# Patient Record
Sex: Male | Born: 1935
Health system: Southern US, Community
[De-identification: ages and names within clinical notes are randomized; demographics above are authoritative.]

## PROBLEM LIST (undated history)

## (undated) DIAGNOSIS — I10 Essential (primary) hypertension: Secondary | ICD-10-CM

## (undated) DIAGNOSIS — E119 Type 2 diabetes mellitus without complications: Secondary | ICD-10-CM

## (undated) HISTORY — PX: TOTAL HIP ARTHROPLASTY: SHX124

---

## 2008-04-08 ENCOUNTER — Encounter: Payer: Self-pay | Admitting: Orthopedic Surgery

## 2008-05-03 ENCOUNTER — Encounter: Payer: Self-pay | Admitting: Orthopedic Surgery

## 2008-06-02 ENCOUNTER — Encounter: Payer: Self-pay | Admitting: Orthopedic Surgery

## 2012-02-27 ENCOUNTER — Ambulatory Visit: Payer: Self-pay | Admitting: General Practice

## 2012-02-27 LAB — MRSA PCR SCREENING

## 2012-02-27 LAB — BASIC METABOLIC PANEL
Anion Gap: 6 — ABNORMAL LOW (ref 7–16)
Calcium, Total: 9.3 mg/dL (ref 8.5–10.1)
Chloride: 104 mmol/L (ref 98–107)
Co2: 27 mmol/L (ref 21–32)
Creatinine: 1.15 mg/dL (ref 0.60–1.30)
Glucose: 95 mg/dL (ref 65–99)
Potassium: 3.8 mmol/L (ref 3.5–5.1)
Sodium: 137 mmol/L (ref 136–145)

## 2012-02-27 LAB — URINALYSIS, COMPLETE
Bilirubin,UR: NEGATIVE
Blood: NEGATIVE
Ketone: NEGATIVE
Ph: 5 (ref 4.5–8.0)
Protein: NEGATIVE
Specific Gravity: 1.021 (ref 1.003–1.030)

## 2012-02-27 LAB — APTT: Activated PTT: 29.6 secs (ref 23.6–35.9)

## 2012-02-27 LAB — CBC
HCT: 52.7 % — ABNORMAL HIGH (ref 40.0–52.0)
MCHC: 34.1 g/dL (ref 32.0–36.0)
MCV: 87 fL (ref 80–100)
RBC: 6.04 10*6/uL — ABNORMAL HIGH (ref 4.40–5.90)
WBC: 12.8 10*3/uL — ABNORMAL HIGH (ref 3.8–10.6)

## 2012-02-27 LAB — SEDIMENTATION RATE: Erythrocyte Sed Rate: 1 mm/hr (ref 0–20)

## 2012-02-27 LAB — PROTIME-INR: Prothrombin Time: 13.2 secs (ref 11.5–14.7)

## 2012-03-06 ENCOUNTER — Inpatient Hospital Stay: Payer: Self-pay

## 2012-03-07 LAB — BASIC METABOLIC PANEL
Anion Gap: 6 — ABNORMAL LOW (ref 7–16)
BUN: 13 mg/dL (ref 7–18)
Calcium, Total: 8 mg/dL — ABNORMAL LOW (ref 8.5–10.1)
Chloride: 101 mmol/L (ref 98–107)
Creatinine: 0.99 mg/dL (ref 0.60–1.30)
EGFR (African American): >60
EGFR (Non-African Amer.): >60
Osmolality: 275 (ref 275–301)

## 2012-03-07 LAB — PLATELET COUNT: Platelet: 194 10*3/uL (ref 150–440)

## 2012-03-08 LAB — BASIC METABOLIC PANEL
Anion Gap: 8 (ref 7–16)
BUN: 10 mg/dL (ref 7–18)
Co2: 30 mmol/L (ref 21–32)
Creatinine: 1.09 mg/dL (ref 0.60–1.30)
EGFR (African American): >60

## 2012-03-08 LAB — PLATELET COUNT: Platelet: 192 10*3/uL (ref 150–440)

## 2012-03-08 LAB — HEMOGLOBIN: HGB: 13.3 g/dL (ref 13.0–18.0)

## 2012-03-11 LAB — PATHOLOGY REPORT

## 2014-10-20 NOTE — Discharge Summary (Signed)
PATIENT NAME:  Frank Cochran, FIDEL MR#:  [id number] DATE OF BIRTH:  09/09/1934  DATE OF ADMISSION:  03/06/2012 DATE OF DISCHARGE:  03/09/2012  ADMITTING DIAGNOSIS: Degenerative arthrosis of the right hip.   DISCHARGE DIAGNOSIS: Degenerative arthrosis of the right hip.   HISTORY: The patient is a 80 year old male who has been followed at [facility] for progression of right hip and groin pain. He had reported approximately a one-year history of discomfort without any apparent trauma or aggravating event. The patient had noticed a progressive decrease in his range of motion as well to the right hip. He was having difficulty with doffing and donning of his socks and shoes. His pain was noted to be interfering with his sleep habits. The patient had not seen any significant improvement in his condition despite nonsteroidal anti-inflammatory medications, intra-articular cortisone injections as well as activity modifications. At the time of surgery, he was not using any type of ambulatory aid. The patient states that the pain had progressed to the point that it was significantly interfering with his activities of daily living. X-rays taken in the [place] showed significant narrowing of the cartilage space with subchondral sclerosis as well as osteophyte formation. After a discussion of the risks and benefits of surgical intervention, the patient expressed his understanding of the risks and benefits and agreed for plans for surgical intervention.   PROCEDURE: Right total hip arthroplasty.   ANESTHESIA: Spinal.   IMPLANTS UTILIZED: DePuy 16.5 mm small stature AML femoral component, 60 mm outer diameter Pinnacle 100 acetabular component, +4 mm 10 degree Pinnacle Marathon polyethylene liner, and a 36 mm M-SPEC femoral head with a +1.5 mm neck length.   HOSPITAL COURSE: The patient tolerated the procedure very well. He had no complications. He was then taken to the PAC-U where he  was stabilized and then transferred to the orthopedic floor. The patient began receiving anticoagulation therapy of Lovenox 30 mg subcutaneous every 12 hours per anesthesia and pharmacy protocol. He was fitted with TED stockings bilaterally. These were allowed to be removed one hour per eight hour shift. His heels were elevated off the bed. The patient was also fitted with the AV-I compression foot pump set at 80 mmHg bilaterally. Calves have been nontender. There has been no evidence of any deep venous thromboses.   The patient has denied any chest pain or shortness of breath. Vital signs have been stable. He has been afebrile. Hemodynamically, he was stable. No transfusions were given. The patient was noted be slightly nauseated on day one, but overcame this very quickly. He has had no complications. There was no vomiting noted.   Physical therapy was initiated on day one for gait training and transfers. Upon being discharged, was ambulating greater than 200 feet. He was able go up and down four sets of steps. He was independent with bed to chair transfers. He was able to recall all hip precautions. Occupational therapy was also initiated on day one for activities of daily living and assistive devices. He has progressed very nicely.   The patient's IV, Foley and Hemovac were discontinued on day two along with a dressing change. The wound was free of any drainage or any signs of infection.   DISPOSITION: The patient is being discharged to home in improved stable condition.   DISCHARGE INSTRUCTIONS:  1. He may weight bear as tolerated. Once again hip precautions were stressed.  2. He is to continue using a walker until cleared by physical therapy to  go to a quad cane.  3. He will receive home health physical therapy.  4. Staples are to be removed on 09/18 followed by an application of benzoin and Steri-Strips. 5. Change dressing as needed.  6. If he has any temperatures of 101.5 or greater or  excessive bleeding, he is to call the office.  7. He has a follow-up appointment on 10/02 with Dr. Skip Estimable at 2:15.  8. He is placed on a regular diet.  9. He is to resume his regular medications that he was on prior to admission except for aspirin and ibuprofen.  10. He was given a prescription for Lovenox 40 mg subcutaneously daily for 14 days and then discontinue and begin taking his 81 mg enteric-coated aspirin again.  11. The patient was advised he may take his ibuprofen once he finishes the Lovenox.  12. He was given a prescription for oxycodone 1 to 2 tablets every 4 to 6 hours p.r.n. for pain as well as another prescription for Ultram 1 to 2 tablets every 4 to 6 hours p.r.n. for pain.   PAST MEDICAL HISTORY:  1. Chickenpox.  2. Hyperlipidemia.  3. Hypertension.  4. Gastroesophageal reflux disease.   ____________________________ Vance Peper, PA jrw:ap D: 03/09/2012 08:02:56 ET T: 03/11/2012 13:02:37 ET JOB#: [id number]  cc: Vance Peper, PA, <Dictator> Juventino Pavone PA ELECTRONICALLY SIGNED 03/15/2012 20:47

## 2014-10-20 NOTE — Op Note (Signed)
PATIENT NAME:  Frank Cochran, CHECK MR#:  [id number] DATE OF BIRTH:  [birth year]  DATE OF PROCEDURE:  03/06/2012  PREOPERATIVE DIAGNOSIS: Degenerative arthrosis of the right hip.   POSTOPERATIVE DIAGNOSIS: Degenerative arthrosis of the right hip.   PROCEDURE PERFORMED: Right total hip arthroplasty.   SURGEON: Laurice Record. Hooten, M.D.   ASSISTANT: Vance Peper, PA-C (required to maintain retraction throughout the procedure)   ANESTHESIA: Spinal.   ESTIMATED BLOOD LOSS: 200 mL.  FLUIDS REPLACED: 3,500 mL crystalloid.  DRAINS: Two medium drains to a Hemovac reservoir.  IMPLANTS UTILIZED: DePuy 16.5 mm small stature AML femoral component, 60 millimeter outer diameter Pinnacle 100 acetabular component, +4 mm 10 degree Pinnacle Marathon polyethylene liner, and a 36 mm M-SPEC femoral head with a +1.5 mm neck length.   INDICATIONS FOR SURGERY: The patient is a 80 year old male who has been seen for complaints of progressive right hip and groin pain. X-rays demonstrated severe degenerative changes with bone on bone articulation noted. After a discussion of the risks and benefits of surgical intervention, the patient expressed his understanding of the risks, benefits, and agreed with plans for surgical intervention.   PROCEDURE IN DETAIL: The patient was brought into the Operating Room and, after adequate spinal anesthesia was achieved, the patient was placed in a left lateral decubitus position. Axillary roll was placed and all bony prominences were well padded. The patient's right hip and leg were cleaned and prepped with alcohol and DuraPrep and draped in the usual sterile fashion. A "time-out" was performed as per usual protocol. A lateral curvilinear incision was made gently curving towards the posterior superior iliac spine. IT band was incised in line with the skin incision and the fibers of the gluteus maximus were split in line. The piriformis tendon was identified, skeletonized, and incised at its  insertion on the proximal femur and reflected posteriorly. In a similar fashion, short external rotators were incised and reflected posteriorly. A T-type posterior capsulotomy was performed. Prior to dislocation of the femoral head, a threaded Steinmann pin was inserted through a separate stab incision into the pelvis superior to the acetabulum and bent in the form of a stylus so as to assess limb length and hip offset throughout the procedure. The femoral head was then dislocated posteriorly. Severe degenerative changes were noted with full thickness loss of articular cartilage superiorly. Femoral neck cut was performed using an oscillating saw. The anterior capsule was elevated off of the femoral neck using a periosteal elevator. Inspection of the acetabulum also demonstrated significant degenerative changes. A remnant of the labrum was excised using electrocautery. The acetabulum was reamed in a sequential fashion up to a 59 mm diameter. This allowed for good punctate bleeding bone. A 60 mm outer diameter Pinnacle 100 acetabular component was positioned and impacted into place. Excellent scratch fit was appreciated. First a +4 neutral polyethylene liner was inserted and attention was directed to the proximal femur. Pilot hole for reaming of the proximal femoral canal was created using a high-speed bur. Proximal femoral canal was reamed in a sequential fashion up to a 16 mm diameter. This allowed for approximately 5 cm of scratch fit. Proximal portion of the femur was then prepared using a 16.5 mm aggressive side-biting reamer. Serial broaches were inserted up to a 16.5 mm small stature broach. The calcar region was planed accordingly and trial reduction was performed using a 1.5 mm neck length. Good equalization of limb lengths was appreciated. Reasonably good stability was appreciated. The trials were removed and  the +4 millimeter neutral polyethylene trial was replaced by a +4 mm trial with a 10 degree face  changing option with the high side positioned at approximately the eight o'clock position. Trial reduction was again performed with the +1.5 mm neck length. Excellent stability was appreciated both anteriorly and posteriorly. Trial components were removed. The acetabular shell was irrigated with copious amounts of normal saline with antibiotic solution and then suctioned dry. A +4 mm 10 degree Pinnacle Marathon polyethylene liner was positioned and impacted into place with the high side positioned at 8:00. Next, a 16.5 mm small stature AML femoral component was positioned and impacted into place. Excellent scratch fit was appreciated. Trial reduction was again performed with a 36 mm ball with a +1.5 mm neck length. Excellent stability was appreciated both anteriorly and posteriorly with good equalization of limb lengths and reapproximation of hip offset. Trial hip ball was removed. The Morse taper was cleaned and dried. A 36 mm M-SPEC femoral head with a +1.5 mm neck length was placed on the trunnion and impacted into place. The hip was reduced and placed through a range of motion. Again, good restoration of hip offset and excellent anterior and posterior stability was appreciated as well as good equalization of limb length.   The wound was irrigated with copious amounts of normal saline with antibiotic solution using pulsatile lavage and then suctioned dry. The posterior capsulotomy was repaired using #5 Ethibond. The piriformis tendon was reapproximated on the undersurface of the gluteus medius tendon using #5 Ethibond. Two medium drains were placed in the wound bed and brought out through a separate stab incision to be attached to a Hemovac reservoir. The IT band was prepared using interrupted sutures of #1 Vicryl. The subcutaneous tissue was approximated in layers using first #0 Vicryl followed by 2-0 Vicryl. Skin was closed with skin staples. A sterile dressing was applied. The patient tolerated the procedure  well. He was transported to the recovery room in stable condition.    ____________________________ Laurice Record. Holley Bouche., MD jph:ap D: 03/07/2012 00:58:04 ET T: 03/07/2012 10:24:59 ET JOB#: [id number]  cc: Laurice Record. Holley Bouche., MD, <Dictator>  JAMES P Holley Bouche MD ELECTRONICALLY SIGNED 03/10/2012 13:14

## 2015-11-23 DIAGNOSIS — K439 Ventral hernia without obstruction or gangrene: Secondary | ICD-10-CM | POA: Diagnosis not present

## 2015-11-23 DIAGNOSIS — K429 Umbilical hernia without obstruction or gangrene: Secondary | ICD-10-CM | POA: Diagnosis not present

## 2015-12-22 DIAGNOSIS — M6208 Separation of muscle (nontraumatic), other site: Secondary | ICD-10-CM | POA: Diagnosis not present

## 2015-12-22 DIAGNOSIS — K429 Umbilical hernia without obstruction or gangrene: Secondary | ICD-10-CM | POA: Diagnosis not present

## 2015-12-22 DIAGNOSIS — K439 Ventral hernia without obstruction or gangrene: Secondary | ICD-10-CM | POA: Diagnosis not present

## 2016-05-22 DIAGNOSIS — R05 Cough: Secondary | ICD-10-CM | POA: Diagnosis not present

## 2016-10-12 DIAGNOSIS — M25551 Pain in right hip: Secondary | ICD-10-CM | POA: Diagnosis not present

## 2016-10-12 DIAGNOSIS — M7061 Trochanteric bursitis, right hip: Secondary | ICD-10-CM | POA: Diagnosis not present

## 2017-08-01 DIAGNOSIS — R05 Cough: Secondary | ICD-10-CM | POA: Diagnosis not present

## 2017-08-01 DIAGNOSIS — J209 Acute bronchitis, unspecified: Secondary | ICD-10-CM | POA: Diagnosis not present

## 2017-08-22 DIAGNOSIS — M25561 Pain in right knee: Secondary | ICD-10-CM | POA: Diagnosis not present

## 2017-08-22 DIAGNOSIS — M5416 Radiculopathy, lumbar region: Secondary | ICD-10-CM | POA: Diagnosis not present

## 2017-08-22 DIAGNOSIS — M7061 Trochanteric bursitis, right hip: Secondary | ICD-10-CM | POA: Diagnosis not present

## 2017-08-22 DIAGNOSIS — M25551 Pain in right hip: Secondary | ICD-10-CM | POA: Diagnosis not present

## 2017-09-03 ENCOUNTER — Other Ambulatory Visit: Payer: Self-pay | Admitting: Physical Medicine and Rehabilitation

## 2017-09-03 DIAGNOSIS — M5416 Radiculopathy, lumbar region: Secondary | ICD-10-CM | POA: Diagnosis not present

## 2017-09-03 DIAGNOSIS — M5136 Other intervertebral disc degeneration, lumbar region: Secondary | ICD-10-CM | POA: Diagnosis not present

## 2017-09-03 DIAGNOSIS — M4726 Other spondylosis with radiculopathy, lumbar region: Secondary | ICD-10-CM | POA: Diagnosis not present

## 2017-09-12 ENCOUNTER — Ambulatory Visit
Admission: RE | Admit: 2017-09-12 | Discharge: 2017-09-12 | Disposition: A | Discharge status: Home / Self Care | Payer: PPO | Source: Ambulatory Visit | Attending: Physical Medicine and Rehabilitation | Admitting: Physical Medicine and Rehabilitation

## 2017-09-12 DIAGNOSIS — M4726 Other spondylosis with radiculopathy, lumbar region: Secondary | ICD-10-CM | POA: Insufficient documentation

## 2017-09-12 DIAGNOSIS — M5416 Radiculopathy, lumbar region: Secondary | ICD-10-CM | POA: Diagnosis present

## 2017-09-12 DIAGNOSIS — M48061 Spinal stenosis, lumbar region without neurogenic claudication: Secondary | ICD-10-CM | POA: Insufficient documentation

## 2017-09-12 DIAGNOSIS — M5116 Intervertebral disc disorders with radiculopathy, lumbar region: Secondary | ICD-10-CM | POA: Insufficient documentation

## 2017-09-18 DIAGNOSIS — M5416 Radiculopathy, lumbar region: Secondary | ICD-10-CM | POA: Diagnosis not present

## 2017-09-18 DIAGNOSIS — M5136 Other intervertebral disc degeneration, lumbar region: Secondary | ICD-10-CM | POA: Diagnosis not present

## 2017-10-29 DIAGNOSIS — M5136 Other intervertebral disc degeneration, lumbar region: Secondary | ICD-10-CM | POA: Diagnosis not present

## 2017-10-29 DIAGNOSIS — M48062 Spinal stenosis, lumbar region with neurogenic claudication: Secondary | ICD-10-CM | POA: Diagnosis not present

## 2017-10-29 DIAGNOSIS — M5416 Radiculopathy, lumbar region: Secondary | ICD-10-CM | POA: Diagnosis not present

## 2017-11-20 DIAGNOSIS — M5136 Other intervertebral disc degeneration, lumbar region: Secondary | ICD-10-CM | POA: Diagnosis not present

## 2017-11-20 DIAGNOSIS — M5416 Radiculopathy, lumbar region: Secondary | ICD-10-CM | POA: Diagnosis not present

## 2017-11-20 DIAGNOSIS — M4726 Other spondylosis with radiculopathy, lumbar region: Secondary | ICD-10-CM | POA: Diagnosis not present

## 2017-12-06 DIAGNOSIS — M48062 Spinal stenosis, lumbar region with neurogenic claudication: Secondary | ICD-10-CM | POA: Diagnosis not present

## 2017-12-06 DIAGNOSIS — M5136 Other intervertebral disc degeneration, lumbar region: Secondary | ICD-10-CM | POA: Diagnosis not present

## 2017-12-06 DIAGNOSIS — M5416 Radiculopathy, lumbar region: Secondary | ICD-10-CM | POA: Diagnosis not present

## 2018-03-31 IMAGING — MR MR LUMBAR SPINE W/O CM
5 series · 35 of 48 positions shown · non-contrast
Comparison: Right hip series 03/06/2012.

CLINICAL DATA: 83-year-old male with chronic pain radiating to the
right leg. Constant numbness from the hip to the foot relieved by
lying flat.

EXAM:
MRI LUMBAR SPINE WITHOUT CONTRAST
TECHNIQUE: Multiplanar, multisequence MR imaging of the lumbar spine was
performed. No intravenous contrast was administered.

[Series 2: T2 · sagittal · 4.0mm · 0.81mm/px · 6 of 21 slices shown (1 of 2)]
[im 1/21]
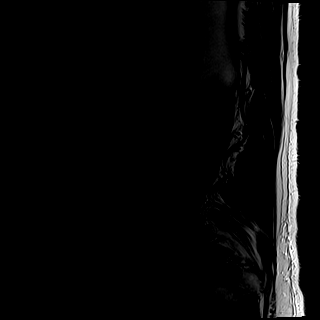
[im 5/21]
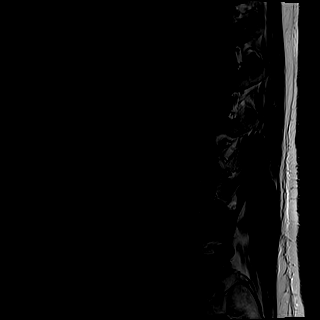
[im 9/21]
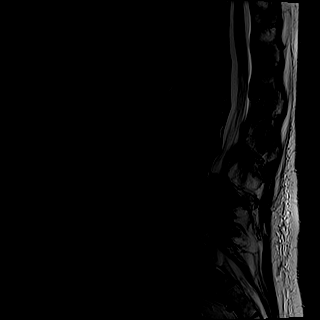
[im 13/21]
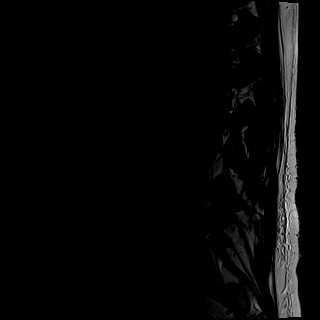
[im 17/21]
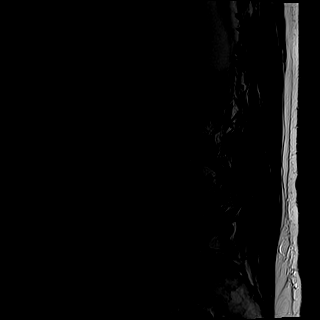
[im 21/21]
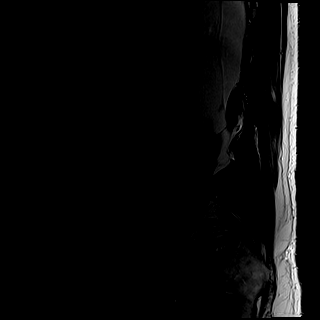

[Series 3: T1 · sagittal · 4.0mm · 0.81mm/px · 7 of 21 slices shown (1 of 2)]
[im 1/21]
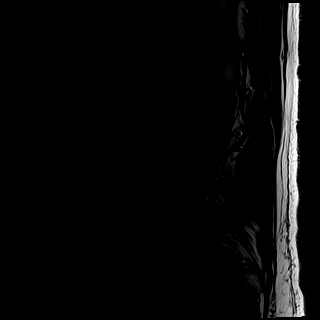
[im 4/21]
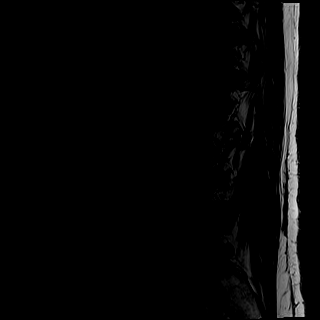
[im 7/21]
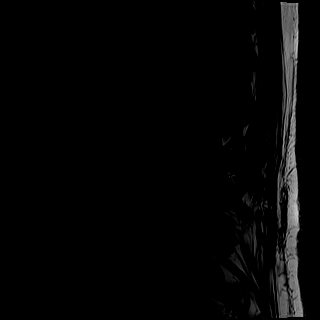
[im 11/21]
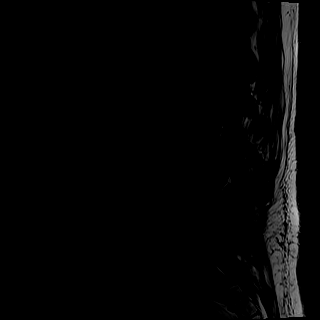
[im 14/21]
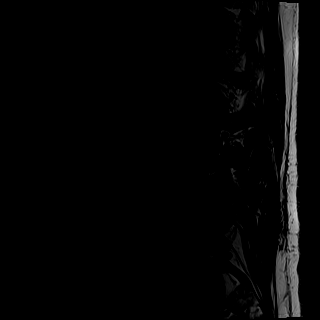
[im 17/21]
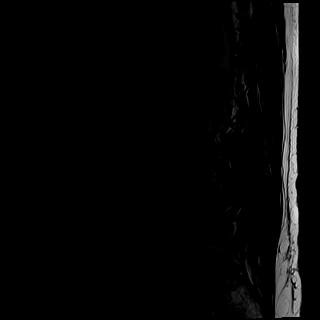
[im 21/21]
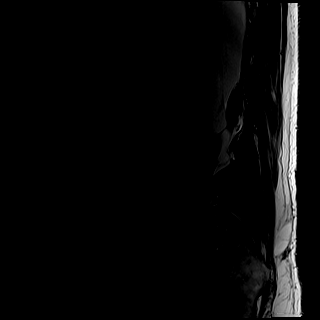

[Series 4: STIR · sagittal · 4.0mm · 1.02mm/px · 6 of 21 slices shown]
[im 1/21]
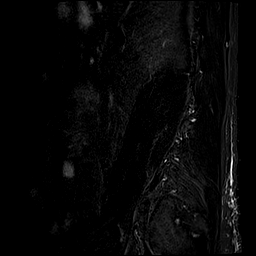
[im 4/21]
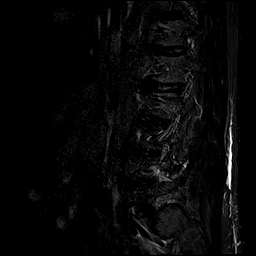
[im 7/21]
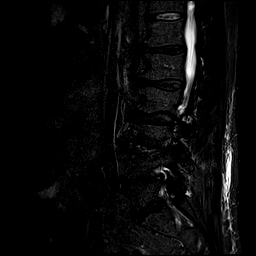
[im 11/21]
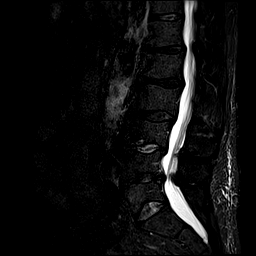
[im 14/21]
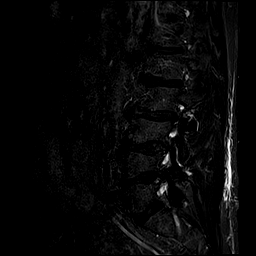
[im 17/21]
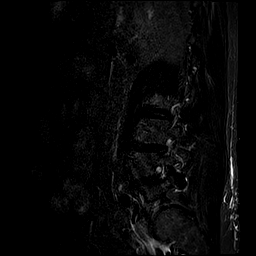

[Series 5: T2 · axial · 4.0mm · 0.78mm/px · z∈[-139,+66]mm · 8 of 42 slices shown (2 of 2)]
[im 1/42]
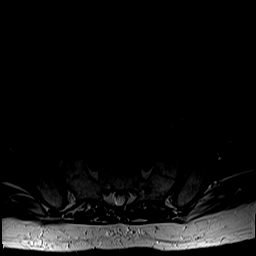
[im 7/42]
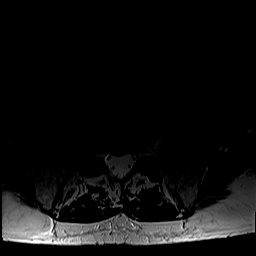
[im 13/42]
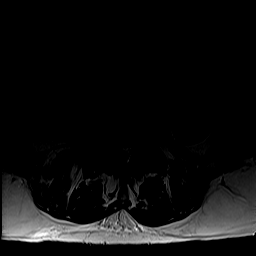
[im 19/42]
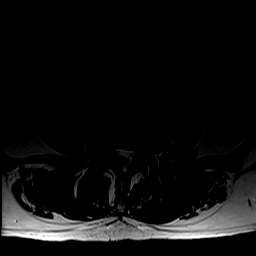
[im 23/42]
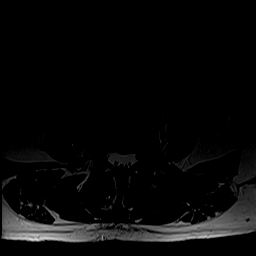
[im 29/42]
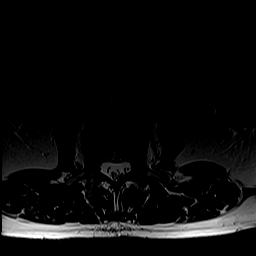
[im 35/42]
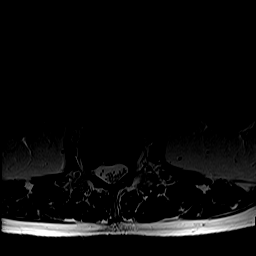
[im 42/42]
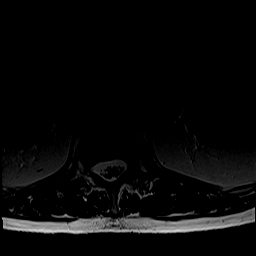

[Series 6: T1 · axial · 4.0mm · 0.39mm/px · z∈[-139,+66]mm · 8 of 42 slices shown (2 of 2)]
[im 1/42]
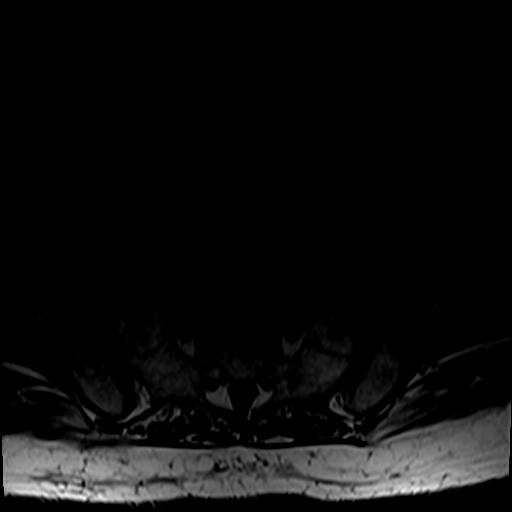
[im 7/42]
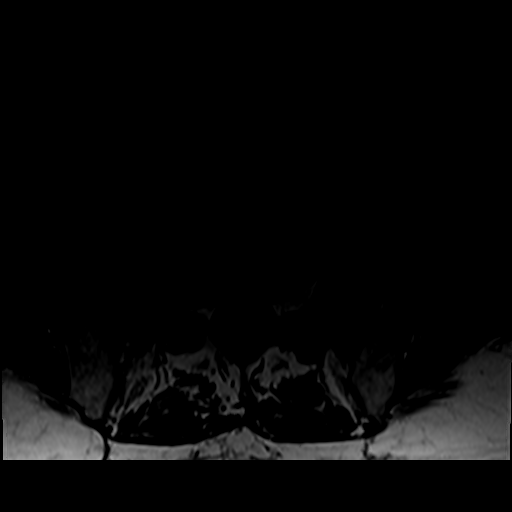
[im 13/42]
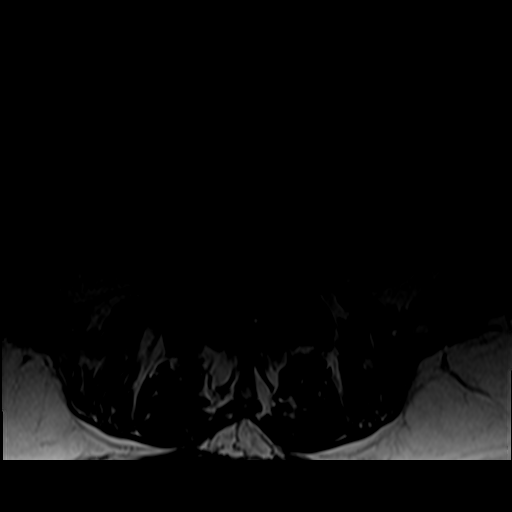
[im 19/42]
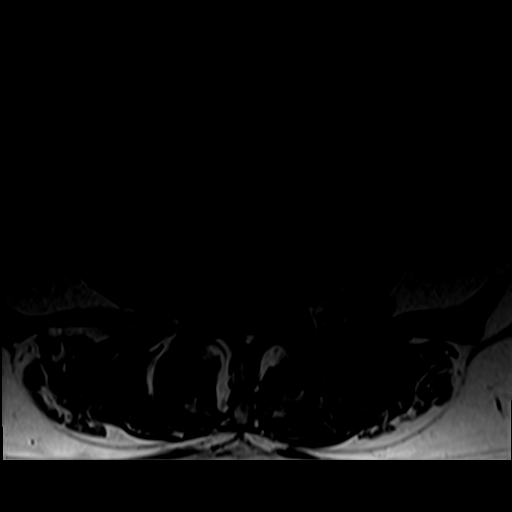
[im 23/42]
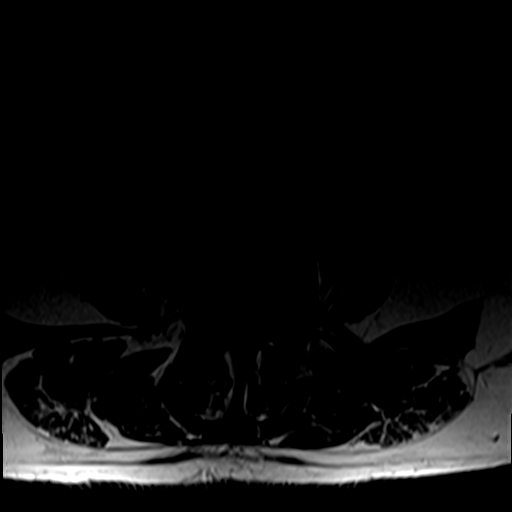
[im 29/42]
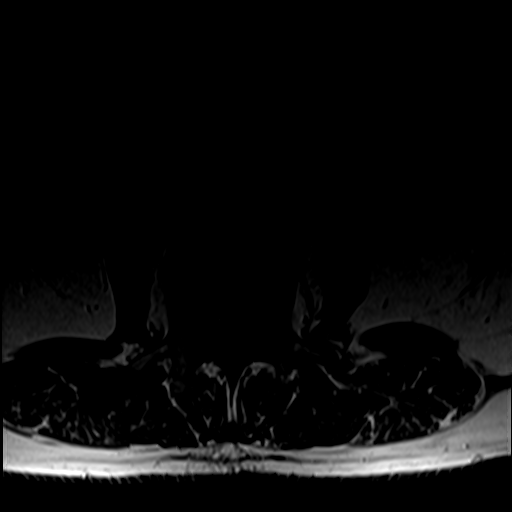
[im 35/42]
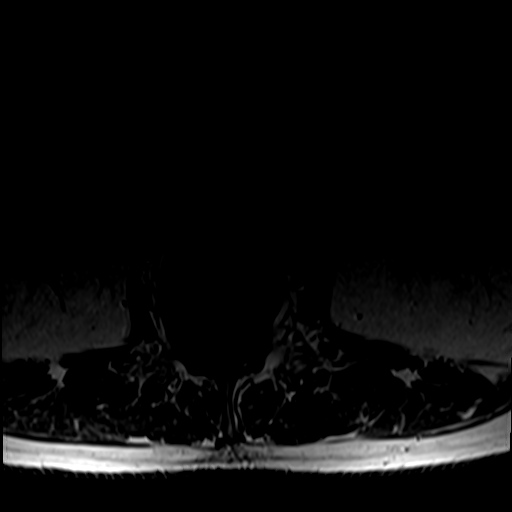
[im 42/42]
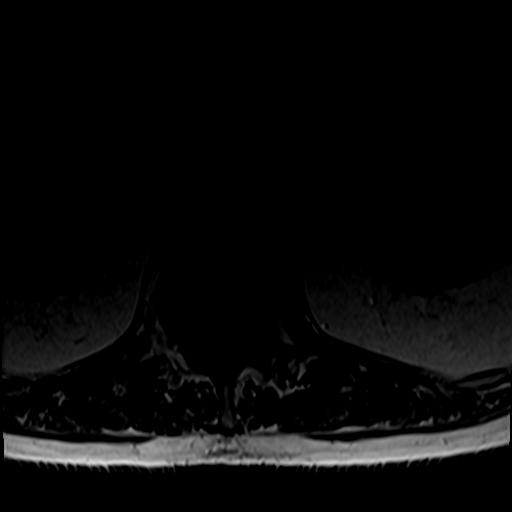

[35 of 48 positions shown; findings below may reference images not displayed]

FINDINGS: Segmentation: Lumbar segmentation appears to be normal and will be
designated as such for this report.

Alignment: Mild straightening of lumbar lordosis. Mild
retrolisthesis of L2 on L3, and L3 on L4. Superimposed mild
levoconvex lumbar scoliosis.

Vertebrae: Chronic degenerative endplate marrow signal changes at
L3-L4, and to a lesser extent L4-L5. Background bone marrow signal
is normal. No marrow edema or evidence of acute osseous abnormality.
Intact visible sacrum and SI joints.

Conus medullaris and cauda equina: Conus extends to the T12-L1
level. Conus and cauda equina appear normal.

Paraspinal and other soft tissues: Diverticulosis in the visible
sigmoid colon. Otherwise negative.

Disc levels:

T11-T12: Mild to moderate facet hypertrophy.

T12-L1: Anterior predominant disc bulging and endplate spurring.
Mild to moderate facet hypertrophy greater on the left. No stenosis.

L1-L2: Anterior predominant disc bulging and endplate spurring. No
stenosis.

L2-L3: Circumferential disc bulge and endplate spurring. Broad-based
posterior and foraminal involvement. Mild facet hypertrophy. No
significant stenosis.

L3-L4: Bulky circumferential disc bulge and endplate spurring.
Broad-based posterior and foraminal involvement. Superimposed
moderate to severe ligament flavum and facet hypertrophy. Mild
spinal stenosis. Mild bilateral lateral recess stenosis (descending
L4 nerve levels). Moderate to severe bilateral L3 neural foraminal
stenosis, greater on the right.

L4-L5: Disc space loss with suspected vacuum disc. Bulky
circumferential disc bulge and endplate spurring. Broad-based
posterior and foraminal involvement. Moderate to severe facet and
ligament flavum hypertrophy, greater on the right. Facet related
posteriorly situated synovial cysts on the right (including series
4, image 5) are noted but should not cause neural compromise.

Moderate to severe spinal stenosis. Moderate to severe bilateral
lateral recess stenosis (L5 nerve levels), greater on the right.
Moderate to severe bilateral L4 neural foraminal stenosis.

L5-S1: Mild right far lateral disc bulging and endplate spurring.
Mild facet hypertrophy. No stenosis.
IMPRESSION: 1. Advanced chronic disc, endplate, and posterior element
degeneration at L4-L5 resulting in moderate to severe spinal
stenosis, bilateral lateral recess and bilateral neural foraminal
stenosis. Query right L4 and/or L5 radiculitis.
2. Disc and posterior element degeneration at L3-L4 with mild spinal
and lateral recess stenosis, but moderate to severe neural foraminal
stenosis - greater on the right. Query right L3 radiculitis.

## 2019-05-05 DIAGNOSIS — C4442 Squamous cell carcinoma of skin of scalp and neck: Secondary | ICD-10-CM | POA: Diagnosis not present

## 2019-05-05 DIAGNOSIS — L570 Actinic keratosis: Secondary | ICD-10-CM | POA: Diagnosis not present

## 2019-05-05 DIAGNOSIS — D485 Neoplasm of uncertain behavior of skin: Secondary | ICD-10-CM | POA: Diagnosis not present

## 2019-06-03 DIAGNOSIS — C4442 Squamous cell carcinoma of skin of scalp and neck: Secondary | ICD-10-CM | POA: Diagnosis not present

## 2019-09-11 ENCOUNTER — Ambulatory Visit: Payer: PPO | Attending: Internal Medicine

## 2019-09-11 DIAGNOSIS — Z20822 Contact with and (suspected) exposure to covid-19: Secondary | ICD-10-CM | POA: Diagnosis not present

## 2019-09-12 ENCOUNTER — Other Ambulatory Visit: Payer: PPO

## 2019-09-12 LAB — NOVEL CORONAVIRUS, NAA: SARS-CoV-2, NAA: NOT DETECTED

## 2019-11-01 ENCOUNTER — Ambulatory Visit: Payer: PPO | Attending: Internal Medicine

## 2019-11-01 DIAGNOSIS — Z23 Encounter for immunization: Secondary | ICD-10-CM

## 2019-11-01 NOTE — Progress Notes (Signed)
   Covid-19 Vaccination Clinic  Name:  Frank Cochran    MRN: [id number] DOB: August 09, 1934  11/01/2019  Frank Cochran was observed post Covid-19 immunization for 15 minutes without incident. He was provided with Vaccine Information Sheet and instruction to access the V-Safe system.   Frank Cochran was instructed to call 911 with any severe reactions post vaccine: Marland Kitchen Difficulty breathing  . Swelling of face and throat  . A fast heartbeat  . A bad rash all over body  . Dizziness and weakness   Immunizations Administered    Name Date Dose VIS Date Route   Pfizer COVID-19 Vaccine 11/01/2019 11:22 AM 0.3 mL 08/27/2018 Intramuscular   Manufacturer: [place]   Lot: [id number]   [place]: [id number]

## 2019-11-25 ENCOUNTER — Ambulatory Visit: Payer: PPO | Attending: Internal Medicine

## 2019-11-25 DIAGNOSIS — Z23 Encounter for immunization: Secondary | ICD-10-CM

## 2019-11-25 NOTE — Progress Notes (Signed)
   Covid-19 Vaccination Clinic  Name:  Frank Cochran    MRN: [id number] DOB: Aug 18, 1934  11/25/2019  [name] was observed post Covid-19 immunization for 15 minutes without incident. He was provided with Vaccine Information Sheet and instruction to access the V-Safe system.   [name] was instructed to call 911 with any severe reactions post vaccine: Marland Kitchen Difficulty breathing  . Swelling of face and throat  . A fast heartbeat  . A bad rash all over body  . Dizziness and weakness   Immunizations Administered    Name Date Dose VIS Date Route   Pfizer COVID-19 Vaccine 11/25/2019 12:59 PM 0.3 mL 08/27/2018 Intramuscular   Manufacturer: [employer]   Lot: [id number]   [place]: [id number]

## 2020-06-12 ENCOUNTER — Ambulatory Visit
Admission: EM | Admit: 2020-06-12 | Discharge: 2020-06-12 | Disposition: A | Discharge status: Home / Self Care | Payer: PPO | Attending: Family | Admitting: Family

## 2020-06-12 ENCOUNTER — Other Ambulatory Visit: Payer: Self-pay

## 2020-06-12 ENCOUNTER — Encounter: Payer: Self-pay | Admitting: Emergency Medicine

## 2020-06-12 DIAGNOSIS — Z20822 Contact with and (suspected) exposure to covid-19: Secondary | ICD-10-CM | POA: Insufficient documentation

## 2020-06-12 DIAGNOSIS — J0100 Acute maxillary sinusitis, unspecified: Secondary | ICD-10-CM | POA: Insufficient documentation

## 2020-06-12 DIAGNOSIS — R059 Cough, unspecified: Secondary | ICD-10-CM | POA: Insufficient documentation

## 2020-06-12 HISTORY — DX: Essential (primary) hypertension: I10

## 2020-06-12 HISTORY — DX: Type 2 diabetes mellitus without complications: E11.9

## 2020-06-12 LAB — RESP PANEL BY RT-PCR (FLU A&B, COVID) ARPGX2
Influenza A by PCR: NEGATIVE
Influenza B by PCR: NEGATIVE
SARS Coronavirus 2 by RT PCR: NEGATIVE

## 2020-06-12 MED ORDER — BENZONATATE 100 MG PO CAPS: ORAL_CAPSULE | ORAL | 0 refills | Status: AC

## 2020-06-12 MED ORDER — AMOXICILLIN-POT CLAVULANATE 875-125 MG PO TABS: 1.0000 | ORAL_TABLET | Freq: Two times a day (BID) | ORAL | 0 refills | Status: AC

## 2020-06-12 NOTE — ED Triage Notes (Signed)
Patient c/o cough, chest congestion, and nasal congestion that started on Monday.  Patient has not checked his temperature.

## 2020-06-12 NOTE — ED Provider Notes (Signed)
[facility]    CSN: [id number] Arrival date & time: 06/12/20  1255      History   Chief Complaint Chief Complaint  Patient presents with  . Cough  . Nasal Congestion    HPI Frank Cochran is a 85 y.o. male.   85 year old male presents with nasal congestion, cough and chest congestion for the past 6 to 7 days. Has drainage going down back of throat and able to spit out mucus but minimal mucus in chest. Denies any fever, sore throat, or GI symptoms. Has taken OTC cough and cold medication with minimal relief. Has been vaccinated against COVID 19. Other chronic health issues include HTN, type 2 DM, hyperlipidemia, GERD and anxiety. Currently on Atenolol, HCTZ, Metformin, Zocor, Prilosec, aspirin and Gabapentin daily and Ativan and Sildenafil prn.   The history is provided by the patient.    Past Medical History:  Diagnosis Date  . Diabetes mellitus without complication ([place])   . Hypertension     There are no problems to display for this patient.   Past Surgical History:  Procedure Laterality Date  . TOTAL HIP ARTHROPLASTY Right        Home Medications    Prior to Admission medications   Medication Sig Start Date End Date Taking? Authorizing Provider  gabapentin (NEURONTIN) 100 MG capsule 1po qHS x 4 days, then bid x 4 days, then tid thereafter. 11/20/17  Yes [provider]  amoxicillin-clavulanate (AUGMENTIN) 875-125 MG tablet Take 1 tablet by mouth every 12 (twelve) hours for 7 days. 06/12/20 06/19/20  Katy Apo, NP  aspirin 81 MG EC tablet Take by mouth.    [provider]  atenolol (TENORMIN) 25 MG tablet Take by mouth.    [provider]  benzonatate (TESSALON) 100 MG capsule Take 1 to 2 capsules by mouth every 8 hours as needed for cough. 06/12/20   Katy Apo, NP  Cholecalciferol 25 MCG (1000 UT) tablet Take by mouth.    [provider]  hydrochlorothiazide (HYDRODIURIL) 25 MG tablet Take by mouth.     [provider]  LORazepam (ATIVAN) 1 MG tablet Take by mouth.    [provider]  metFORMIN (GLUCOPHAGE) 1000 MG tablet Take by mouth.    [provider]  mometasone (NASONEX) 50 MCG/ACT nasal spray Place into the nose.    [provider]  Multiple Vitamin (MULTI-VITAMIN) tablet Take 1 tablet by mouth daily.    [provider]  omeprazole (PRILOSEC OTC) 20 MG tablet Take by mouth.    [provider]  sildenafil (VIAGRA) 50 MG tablet Take by mouth.    [provider]  simvastatin (ZOCOR) 80 MG tablet Take by mouth.    [provider]    Family History History reviewed. No pertinent family history.  Social History Social History   Tobacco Use  . Smoking status: Never Smoker  . Smokeless tobacco: Never Used  Vaping Use  . Vaping Use: Never used  Substance Use Topics  . Alcohol use: Never  . Drug use: Never     Allergies   Lisinopril   Review of Systems Review of Systems  Constitutional: Positive for fatigue. Negative for activity change, appetite change, chills and fever.  HENT: Positive for congestion, postnasal drip and sinus pressure. Negative for ear discharge, ear pain, facial swelling, mouth sores, nosebleeds, rhinorrhea, sinus pain, sore throat and trouble swallowing.   Eyes: Negative for pain, discharge, redness and itching.  Respiratory: Positive for cough. Negative for chest tightness, shortness of breath and wheezing.   Gastrointestinal: Negative for diarrhea, nausea and vomiting.  Musculoskeletal: Negative for arthralgias, myalgias, neck pain and neck stiffness.  Skin: Negative for color change and rash.  Allergic/Immunologic: Positive for environmental allergies. Negative for food allergies and immunocompromised state.  Neurological: Positive for headaches. Negative for dizziness, seizures, syncope, weakness and light-headedness.  Hematological: Negative for adenopathy. Does not bruise/bleed  easily.     Physical Exam Triage Vital Signs ED Triage Vitals  Enc Vitals Group     BP 06/12/20 1344 126/75     Pulse Rate 06/12/20 1344 65     Resp 06/12/20 1344 16     Temp 06/12/20 1344 98.2 F (36.8 C)     Temp Source 06/12/20 1344 Oral     SpO2 06/12/20 1344 94 %     Weight 06/12/20 1339 230 lb (104.3 kg)     Height 06/12/20 1339 6\' 2"  (1.88 m)     Head Circumference --      Peak Flow --      Pain Score 06/12/20 1338 0     Pain Loc --      Pain Edu? --      Excl. in [place]? --    No data found.  Updated Vital Signs BP 126/75 (BP Location: Right Arm)   Pulse 65   Temp 98.2 F (36.8 C) (Oral)   Resp 16   Ht 6\' 2"  (1.88 m)   Wt 230 lb (104.3 kg)   SpO2 94%   BMI 29.53 kg/m   Visual Acuity Right Eye Distance:   Left Eye Distance:   Bilateral Distance:    Right Eye Near:   Left Eye Near:    Bilateral Near:     Physical Exam Vitals and nursing note reviewed.  Constitutional:      General: He is awake. He is not in acute distress.    Appearance: He is well-developed and well-groomed.     Comments: He is sitting comfortably on the exam table in no acute distress.   HENT:     Head: Normocephalic and atraumatic.     Right Ear: Hearing, tympanic membrane, ear canal and external ear normal.     Left Ear: Hearing, tympanic membrane, ear canal and external ear normal.     Nose: Congestion present.     Right Turbinates: Enlarged.     Left Turbinates: Enlarged.     Right Sinus: Maxillary sinus tenderness present. No frontal sinus tenderness.     Left Sinus: Maxillary sinus tenderness present. No frontal sinus tenderness.     Mouth/Throat:     Lips: Pink.     Mouth: Mucous membranes are moist.     Pharynx: Uvula midline. Oropharyngeal exudate and posterior oropharyngeal erythema present. No pharyngeal swelling or uvula swelling.     Comments: Yellowish post nasal drainage present.  Eyes:     Extraocular Movements: Extraocular movements intact.      Conjunctiva/sclera: Conjunctivae normal.  Cardiovascular:     Rate and Rhythm: Normal rate and regular rhythm.     Heart sounds: Normal heart sounds. No murmur heard.   Pulmonary:     Effort: Pulmonary effort is normal. No respiratory distress.     Breath sounds: Normal breath sounds and air entry. No decreased air movement. No decreased breath sounds, wheezing, rhonchi or rales.  Musculoskeletal:     Cervical back: Normal range of motion and neck supple. No rigidity.  Lymphadenopathy:     Cervical: No cervical adenopathy.  Skin:    General: Skin is warm and dry.     Capillary Refill: Capillary refill takes less than 2 seconds.     Findings: No rash.  Neurological:     General: No focal deficit present.     Mental Status: He is alert and oriented to person, place, and time.  Psychiatric:        Mood and Affect: Mood normal.        Behavior: Behavior normal. Behavior is cooperative.        Thought Content: Thought content normal.        Judgment: Judgment normal.      UC Treatments / Results  Labs (all labs ordered are listed, but only abnormal results are displayed) Labs Reviewed  RESP PANEL BY RT-PCR (FLU A&B, COVID) [id number]    EKG   Radiology No results found.  Procedures Procedures (including critical care time)  Medications Ordered in UC Medications - No data to display  Initial Impression / Assessment and Plan / UC Course  I have reviewed the triage vital signs and the nursing notes.  Pertinent labs & imaging results that were available during my care of the patient were reviewed by me and considered in my medical decision making (see chart for details).    Reviewed negative rapid COVID and Influenza test results with patient. Discussed that he probably has a sinus infection. Recommend start Augmentin 875mg  twice a day as directed. May take Tessalon cough pills 1 to 2 every 8 hours as needed. Continue to push fluids to help loosen up mucus in sinuses.  Follow-up in 4 to 5 days if not improving.   Final Clinical Impressions(s) / UC Diagnoses   Final diagnoses:  Acute non-recurrent maxillary sinusitis  Cough     Discharge Instructions     Recommend start Augmentin 875mg  twice a day as directed. May also take Tessalon cough pills 1 to 2 every 8 hours as needed. Increase fluids to help loosen up mucus. Follow-up in 4 to 5 days if not improving.     ED Prescriptions    Medication Sig Dispense Auth. Provider   amoxicillin-clavulanate (AUGMENTIN) 875-125 MG tablet Take 1 tablet by mouth every 12 (twelve) hours for 7 days. 14 tablet Sofie Schendel, Nicholes Stairs, NP   benzonatate (TESSALON) 100 MG capsule Take 1 to 2 capsules by mouth every 8 hours as needed for cough. 30 capsule Adja Ruff, Nicholes Stairs, NP     PDMP not reviewed this encounter.   Katy Apo, NP 06/12/20 2259

## 2020-06-12 NOTE — Discharge Instructions (Addendum)
Recommend start Augmentin 875mg  twice a day as directed. May also take Tessalon cough pills 1 to 2 every 8 hours as needed. Increase fluids to help loosen up mucus. Follow-up in 4 to 5 days if not improving.

## 2020-09-21 DIAGNOSIS — D485 Neoplasm of uncertain behavior of skin: Secondary | ICD-10-CM | POA: Diagnosis not present

## 2020-09-21 DIAGNOSIS — D239 Other benign neoplasm of skin, unspecified: Secondary | ICD-10-CM | POA: Diagnosis not present

## 2020-09-21 DIAGNOSIS — L570 Actinic keratosis: Secondary | ICD-10-CM | POA: Diagnosis not present

## 2020-09-21 DIAGNOSIS — L578 Other skin changes due to chronic exposure to nonionizing radiation: Secondary | ICD-10-CM | POA: Diagnosis not present

## 2020-09-21 DIAGNOSIS — C44612 Basal cell carcinoma of skin of right upper limb, including shoulder: Secondary | ICD-10-CM | POA: Diagnosis not present

## 2020-09-21 DIAGNOSIS — Z872 Personal history of diseases of the skin and subcutaneous tissue: Secondary | ICD-10-CM | POA: Diagnosis not present

## 2020-09-21 DIAGNOSIS — D045 Carcinoma in situ of skin of trunk: Secondary | ICD-10-CM | POA: Diagnosis not present

## 2020-10-20 DIAGNOSIS — D045 Carcinoma in situ of skin of trunk: Secondary | ICD-10-CM | POA: Diagnosis not present

## 2020-10-20 DIAGNOSIS — C44529 Squamous cell carcinoma of skin of other part of trunk: Secondary | ICD-10-CM | POA: Diagnosis not present

## 2020-11-03 DIAGNOSIS — C44712 Basal cell carcinoma of skin of right lower limb, including hip: Secondary | ICD-10-CM | POA: Diagnosis not present

## 2020-11-03 DIAGNOSIS — C4491 Basal cell carcinoma of skin, unspecified: Secondary | ICD-10-CM | POA: Diagnosis not present

## 2021-03-24 DIAGNOSIS — Z859 Personal history of malignant neoplasm, unspecified: Secondary | ICD-10-CM | POA: Diagnosis not present

## 2021-03-24 DIAGNOSIS — Z85828 Personal history of other malignant neoplasm of skin: Secondary | ICD-10-CM | POA: Diagnosis not present

## 2021-03-24 DIAGNOSIS — L910 Hypertrophic scar: Secondary | ICD-10-CM | POA: Diagnosis not present

## 2021-03-24 DIAGNOSIS — L821 Other seborrheic keratosis: Secondary | ICD-10-CM | POA: Diagnosis not present

## 2021-03-24 DIAGNOSIS — C44712 Basal cell carcinoma of skin of right lower limb, including hip: Secondary | ICD-10-CM | POA: Diagnosis not present

## 2021-03-24 DIAGNOSIS — D485 Neoplasm of uncertain behavior of skin: Secondary | ICD-10-CM | POA: Diagnosis not present

## 2021-03-24 DIAGNOSIS — D239 Other benign neoplasm of skin, unspecified: Secondary | ICD-10-CM | POA: Diagnosis not present

## 2021-03-24 DIAGNOSIS — L578 Other skin changes due to chronic exposure to nonionizing radiation: Secondary | ICD-10-CM | POA: Diagnosis not present

## 2021-03-24 DIAGNOSIS — Z872 Personal history of diseases of the skin and subcutaneous tissue: Secondary | ICD-10-CM | POA: Diagnosis not present

## 2021-03-24 DIAGNOSIS — L570 Actinic keratosis: Secondary | ICD-10-CM | POA: Diagnosis not present

## 2021-04-14 DIAGNOSIS — C44712 Basal cell carcinoma of skin of right lower limb, including hip: Secondary | ICD-10-CM | POA: Diagnosis not present

## 2021-04-14 DIAGNOSIS — C4491 Basal cell carcinoma of skin, unspecified: Secondary | ICD-10-CM | POA: Diagnosis not present
